# Patient Record
Sex: Male | Born: 1980 | Race: Asian | Hispanic: No | Marital: Married | Smoking: Never smoker
Health system: Southern US, Community
[De-identification: ages and names within clinical notes are randomized; demographics above are authoritative.]

## PROBLEM LIST (undated history)

## (undated) DIAGNOSIS — L80 Vitiligo: Secondary | ICD-10-CM

## (undated) DIAGNOSIS — E785 Hyperlipidemia, unspecified: Secondary | ICD-10-CM

## (undated) HISTORY — DX: Hyperlipidemia, unspecified: E78.5

## (undated) HISTORY — PX: NASAL SEPTUM SURGERY: SHX37

## (undated) HISTORY — DX: Vitiligo: L80

---

## 2014-03-20 ENCOUNTER — Ambulatory Visit (INDEPENDENT_AMBULATORY_CARE_PROVIDER_SITE_OTHER): Payer: Managed Care, Other (non HMO) | Admitting: Family Medicine

## 2014-03-20 ENCOUNTER — Encounter: Payer: Self-pay | Admitting: Family Medicine

## 2014-03-20 VITALS — BP 111/68 | HR 68 | Temp 98.2°F | Resp 20 | Ht 66.75 in | Wt 136.2 lb

## 2014-03-20 DIAGNOSIS — J029 Acute pharyngitis, unspecified: Secondary | ICD-10-CM

## 2014-03-20 DIAGNOSIS — R05 Cough: Secondary | ICD-10-CM

## 2014-03-20 DIAGNOSIS — J3489 Other specified disorders of nose and nasal sinuses: Secondary | ICD-10-CM

## 2014-03-20 DIAGNOSIS — R040 Epistaxis: Secondary | ICD-10-CM

## 2014-03-20 DIAGNOSIS — R0981 Nasal congestion: Secondary | ICD-10-CM

## 2014-03-20 DIAGNOSIS — R059 Cough, unspecified: Secondary | ICD-10-CM

## 2014-03-20 LAB — POCT RAPID STREP A (OFFICE): Rapid Strep A Screen: NEGATIVE

## 2014-03-20 MED ORDER — AMOXICILLIN 875 MG PO TABS
875.0000 mg | ORAL_TABLET | Freq: Two times a day (BID) | ORAL | Status: DC
Start: 2014-03-20 — End: 2014-05-14

## 2014-03-20 NOTE — Patient Instructions (Signed)
Drink plenty of fluids including water, juice, tea.  Take over-the-counter loratadine one daily. The pharmacist can help you to find this.  Take the amoxicillin one twice daily for infection  Take Tylenol (acetaminophen, paracetamol) 500 mg 2 tab 2 or 3 times daily as needed for pain in throat  Return if worse  Referral is being made to a specialist, and ear nose and throat (ENT) doctor. The referrals staff at this office will make that appointment and will contact you. If you do not hear from this office within the next 10 days, call and ask if the referral is being made.

## 2014-03-20 NOTE — Progress Notes (Signed)
Subjective:  33 year old patient from [place] who is here in the [place] studying English and he intends to study health administration. He is here with his wife and daughter. He has been ill for about 3 days with a sore throat and some cough. He does intermittently blows some blood from his nose, has a long history of that and what sounds like sinus problems. His doctor in [place] had plan to do some nasal surgery, but there was not sufficient time before he came to the [place]. He has not been having a fever, he does cough up some phlegm which is bloody at times.gets some blood from his nose.this is been bothering him for a long time. No records are available from his doctor in [place].  Objective: 33 year old man who speaks limited broken [place]. His wife speaks better [place] and helped to interpret. His TMs are normal. Nose appears normal. Throat clear without erythema. Strep screen was taken due to the pain. Neck supple without significant nodes.chest is clear to auscultation. Heart regular without murmurs.  Assessment: Pharyngitis Cough History of nasal/sinus problems  Plan: Strep screen is taken  Results for orders placed in visit on 03/20/14  POCT RAPID STREP A (OFFICE)      Result Value Ref Range   Rapid Strep A Screen Negative  Negative   In repeat discussing things it turns out he does not cough up blood, just gets the blood from his nose.he has no records from his doctor in [place]. He cannot get the name or the records.

## 2014-03-23 LAB — CULTURE, GROUP A STREP: Organism ID, Bacteria: NORMAL

## 2014-04-27 ENCOUNTER — Ambulatory Visit (HOSPITAL_COMMUNITY)
Admission: RE | Admit: 2014-04-27 | Discharge: 2014-04-27 | Disposition: A | Discharge status: Home / Self Care | Payer: Medicare HMO | Source: Ambulatory Visit | Attending: Otolaryngology | Admitting: Otolaryngology

## 2014-04-27 ENCOUNTER — Other Ambulatory Visit (HOSPITAL_COMMUNITY): Payer: Self-pay | Admitting: Otolaryngology

## 2014-04-27 DIAGNOSIS — J329 Chronic sinusitis, unspecified: Secondary | ICD-10-CM | POA: Insufficient documentation

## 2014-05-14 ENCOUNTER — Ambulatory Visit (INDEPENDENT_AMBULATORY_CARE_PROVIDER_SITE_OTHER): Payer: Managed Care, Other (non HMO) | Admitting: Physician Assistant

## 2014-05-14 VITALS — BP 120/70 | HR 100 | Temp 102.2°F | Resp 20 | Ht 66.0 in | Wt 135.2 lb

## 2014-05-14 DIAGNOSIS — J029 Acute pharyngitis, unspecified: Secondary | ICD-10-CM

## 2014-05-14 DIAGNOSIS — J039 Acute tonsillitis, unspecified: Secondary | ICD-10-CM

## 2014-05-14 LAB — POCT CBC
Granulocyte percent: 81.6 %G — AB (ref 37–80)
HCT, POC: 42.7 % — AB (ref 43.5–53.7)
Hemoglobin: 14.2 g/dL (ref 14.1–18.1)
Lymph, poc: 1.2 (ref 0.6–3.4)
MCH, POC: 30.2 pg (ref 27–31.2)
MCHC: 33.3 g/dL (ref 31.8–35.4)
MCV: 90.8 fL (ref 80–97)
MID (cbc): 0.5 (ref 0–0.9)
MPV: 6.6 fL (ref 0–99.8)
PLATELET COUNT, POC: 236 10*3/uL (ref 142–424)
POC Granulocyte: 7.2 — AB (ref 2–6.9)
POC LYMPH %: 13.1 % (ref 10–50)
POC MID %: 5.3 %M (ref 0–12)
RBC: 4.7 M/uL (ref 4.69–6.13)
RDW, POC: 13.7 %
WBC: 8.8 10*3/uL (ref 4.6–10.2)

## 2014-05-14 LAB — POCT RAPID STREP A (OFFICE): RAPID STREP A SCREEN: NEGATIVE

## 2014-05-14 MED ORDER — FIRST-MOUTHWASH BLM MT SUSP: OROMUCOSAL | Status: DC

## 2014-05-14 MED ORDER — CLINDAMYCIN HCL 300 MG PO CAPS: 300.0000 mg | ORAL_CAPSULE | Freq: Three times a day (TID) | ORAL | Status: DC

## 2014-05-14 NOTE — Progress Notes (Signed)
   Subjective:    Patient ID: Miguel Stewart, male    DOB: 02-Jan-1981, 33 y.o.   MRN: [id number]  HPI Pt presents to clinic with sore throat for the last 2 days that seems to be getting worse and congestion without rhinorrhea.  His daughter was sick but only with congestion she is not complaining of a sore throat.  He has had fevers and chills since the onset 3 days ago.  He had Augmentin that he had at home for 3 days but he is not feeling any better since starting that. He has myalgias and just does not feel good.  OTC meds - tylenol Sick contacts - family members are also sick  Review of Systems  Constitutional: Positive for chills. Negative for fever.  HENT: Positive for congestion, ear pain, sneezing and sore throat. Negative for rhinorrhea.   Respiratory: Negative for cough.   Gastrointestinal: Negative for nausea, vomiting and diarrhea.  Musculoskeletal: Negative for myalgias.  Neurological: Positive for headaches.       Objective:   Physical Exam  Vitals reviewed. Constitutional: He is oriented to person, place, and time. He appears well-developed and well-nourished.  HENT:  Head: Normocephalic and atraumatic.  Right Ear: Hearing, tympanic membrane, external ear and ear canal normal.  Left Ear: Hearing, tympanic membrane, external ear and ear canal normal.  Nose: Nose normal.  Mouth/Throat: Uvula is midline. Oropharyngeal exudate, posterior oropharyngeal edema (ulcerations with exudate on bilateral tonsils) and posterior oropharyngeal erythema present.  Cardiovascular: Normal rate, regular rhythm and normal heart sounds.   No murmur heard. Pulmonary/Chest: Effort normal and breath sounds normal. He has no wheezes.  Lymphadenopathy:       Head (right side): No tonsillar and no occipital adenopathy present.       Head (left side): No tonsillar and no occipital adenopathy present.    He has cervical adenopathy.       Right cervical: Superficial cervical adenopathy present.         Left cervical: Superficial cervical adenopathy present.       Right: No supraclavicular adenopathy present.       Left: No supraclavicular adenopathy present.  Neurological: He is alert and oriented to person, place, and time.  Skin: Skin is warm and dry.  Psychiatric: He has a normal mood and affect. His behavior is normal. Judgment and thought content normal.      Assessment & Plan:  Sore throat - Plan: POCT rapid strep A, POCT CBC, DPH-Lido-AlHydr-MgHydr-Simeth (FIRST-MOUTHWASH BLM) SUSP  Acute tonsillitis - Plan: clindamycin (CLEOCIN) 300 MG capsule  Due to his past 3 days of abx use and granulocyte elevation and tonsillar ulceration and fever today we are going to cover with clindamycin because after 3 days of Augmentin he is not feeling better.  He will add regular dosing of Tylenol and motrin to help control his fever which will make him feel better.  Other symptomatic care d/w pt.  Benny Lennert PA-C  [facility] 05/14/2014 3:34 PM

## 2014-06-15 ENCOUNTER — Ambulatory Visit (INDEPENDENT_AMBULATORY_CARE_PROVIDER_SITE_OTHER): Payer: Managed Care, Other (non HMO) | Admitting: Emergency Medicine

## 2014-06-15 VITALS — BP 116/70 | HR 64 | Temp 97.8°F | Resp 18 | Ht 67.0 in | Wt 138.0 lb

## 2014-06-15 DIAGNOSIS — L80 Vitiligo: Secondary | ICD-10-CM

## 2014-06-15 DIAGNOSIS — Z13220 Encounter for screening for lipoid disorders: Secondary | ICD-10-CM

## 2014-06-15 LAB — COMPREHENSIVE METABOLIC PANEL
ALBUMIN: 4.5 g/dL (ref 3.5–5.2)
ALT: 18 U/L (ref 0–53)
AST: 18 U/L (ref 0–37)
Alkaline Phosphatase: 30 U/L — ABNORMAL LOW (ref 39–117)
BUN: 10 mg/dL (ref 6–23)
CHLORIDE: 101 meq/L (ref 96–112)
CO2: 28 meq/L (ref 19–32)
CREATININE: 0.81 mg/dL (ref 0.50–1.35)
Calcium: 9.6 mg/dL (ref 8.4–10.5)
Glucose, Bld: 92 mg/dL (ref 70–99)
POTASSIUM: 4.3 meq/L (ref 3.5–5.3)
Sodium: 138 mEq/L (ref 135–145)
Total Bilirubin: 0.5 mg/dL (ref 0.2–1.2)
Total Protein: 7 g/dL (ref 6.0–8.3)

## 2014-06-15 LAB — POCT CBC
Granulocyte percent: 46.8 %G (ref 37–80)
HEMATOCRIT: 44.2 % (ref 43.5–53.7)
HEMOGLOBIN: 14.3 g/dL (ref 14.1–18.1)
Lymph, poc: 2.3 (ref 0.6–3.4)
MCH: 29.4 pg (ref 27–31.2)
MCHC: 32.3 g/dL (ref 31.8–35.4)
MCV: 91.1 fL (ref 80–97)
MID (cbc): 0.3 (ref 0–0.9)
MPV: 6.9 fL (ref 0–99.8)
POC Granulocyte: 2.2 (ref 2–6.9)
POC LYMPH PERCENT: 47 %L (ref 10–50)
POC MID %: 6.2 %M (ref 0–12)
Platelet Count, POC: 193 10*3/uL (ref 142–424)
RBC: 4.85 M/uL (ref 4.69–6.13)
RDW, POC: 13 %
WBC: 4.8 10*3/uL (ref 4.6–10.2)

## 2014-06-15 LAB — LIPID PANEL
CHOL/HDL RATIO: 1.9 ratio
CHOLESTEROL: 276 mg/dL — AB (ref 0–200)
HDL: 145 mg/dL (ref >39)
LDL Cholesterol: 103 mg/dL — ABNORMAL HIGH (ref 0–99)
Triglycerides: 141 mg/dL (ref <150)
VLDL: 28 mg/dL (ref 0–40)

## 2014-06-15 MED ORDER — BETAMETHASONE VALERATE 0.1 % EX OINT: 1.0000 "application " | TOPICAL_OINTMENT | Freq: Two times a day (BID) | CUTANEOUS | Status: DC

## 2014-06-15 NOTE — Progress Notes (Signed)
[facility] [address], [place] [address] [phone]  Date:  06/15/2014   Name:  Miguel Stewart   DOB:  07/20/1981   MRN:  [id number]  PCP:  No PCP Per Patient    Chief Complaint: rx refills and Hyperlipidemia   History of Present Illness:  Miguel Stewart is a 33 y.o. very pleasant male patient who presents with the following:  Requesting a refill on medication for his steroid cream.  Was prescribed in [place]. Skin lesion has improved but is not resolved Requests labs to check for HLD No history and not on meds. No improvement with over the counter medications or other home remedies.  Denies other complaint or health concern today.   There are no active problems to display for this patient.   Past Medical History  Diagnosis Date  . Hyperlipidemia     History reviewed. No pertinent past surgical history.  History  Substance Use Topics  . Smoking status: Never Smoker   . Smokeless tobacco: Not on file  . Alcohol Use: No    History reviewed. No pertinent family history.  No Known Allergies  Medication list has been reviewed and updated.  Current Outpatient Prescriptions on File Prior to Visit  Medication Sig Dispense Refill  . clindamycin (CLEOCIN) 300 MG capsule Take 1 capsule (300 mg total) by mouth 3 (three) times daily.  30 capsule  0  . DPH-Lido-AlHydr-MgHydr-Simeth (FIRST-MOUTHWASH BLM) SUSP 5 ml every 1-2h prn sore throat - swish gargle spit.  120 mL  0   No current facility-administered medications on file prior to visit.    Review of Systems:  As per HPI, otherwise negative.    Physical Examination: Filed Vitals:   06/15/14 1525  BP: 116/70  Pulse: 64  Temp: 97.8 F (36.6 C)  Resp: 18   Filed Vitals:   06/15/14 1525  Height: 5\' 7"  (1.702 m)  Weight: 138 lb (62.596 kg)   Body mass index is 21.61 kg/(m^2). Ideal Body Weight: Weight in (lb) to have BMI = 25: 159.3  GEN: WDWN, NAD, Non-toxic, A & O  x 3 HEENT: Atraumatic, Normocephalic. Neck supple. No masses, No LAD. Ears and Nose: No external deformity. CV: RRR, No M/G/R. No JVD. No thrill. No extra heart sounds. PULM: CTA B, no wheezes, crackles, rhonchi. No retractions. No resp. distress. No accessory muscle use. ABD: S, NT, ND, +BS. No rebound. No HSM. EXTR: No c/c/e NEURO Normal gait.  PSYCH: Normally interactive. Conversant. Not depressed or anxious appearing.  Calm demeanor.  Skin: vitiligo   Assessment and Plan: Vitiligo Refill meds Labs for HLD  Signed,  Phillips OdorJeffery Azka Steger, MD

## 2014-06-16 ENCOUNTER — Other Ambulatory Visit: Payer: Self-pay | Admitting: Emergency Medicine

## 2014-06-16 MED ORDER — ATORVASTATIN CALCIUM 20 MG PO TABS: 20.0000 mg | ORAL_TABLET | Freq: Every day | ORAL | Status: DC

## 2014-06-20 ENCOUNTER — Encounter: Payer: Self-pay | Admitting: Radiology

## 2014-07-13 ENCOUNTER — Ambulatory Visit (INDEPENDENT_AMBULATORY_CARE_PROVIDER_SITE_OTHER): Payer: Managed Care, Other (non HMO) | Admitting: Family Medicine

## 2014-07-13 VITALS — BP 110/60 | HR 77 | Temp 98.0°F | Resp 16 | Ht 67.0 in | Wt 136.8 lb

## 2014-07-13 DIAGNOSIS — J208 Acute bronchitis due to other specified organisms: Secondary | ICD-10-CM

## 2014-07-13 DIAGNOSIS — R05 Cough: Secondary | ICD-10-CM

## 2014-07-13 DIAGNOSIS — L80 Vitiligo: Secondary | ICD-10-CM

## 2014-07-13 DIAGNOSIS — R059 Cough, unspecified: Secondary | ICD-10-CM

## 2014-07-13 MED ORDER — BENZONATATE 100 MG PO CAPS: 100.0000 mg | ORAL_CAPSULE | Freq: Three times a day (TID) | ORAL | Status: DC | PRN

## 2014-07-13 MED ORDER — AZITHROMYCIN 250 MG PO TABS: ORAL_TABLET | ORAL | Status: DC

## 2014-07-13 MED ORDER — TACROLIMUS 0.1 % EX OINT: TOPICAL_OINTMENT | Freq: Two times a day (BID) | CUTANEOUS | Status: DC

## 2014-07-13 NOTE — Progress Notes (Signed)
[facility] [address], [place] [address] [phone]  Date:  07/13/2014   Name:  Miguel Stewart   DOB:  08/29/1981   MRN:  [id number]  PCP:  No PCP Per Patient    Chief Complaint: Cough   History of Present Illness:  Miguel Stewart is a 33 y.o. very pleasant male patient who presents with the following:  Here today with cough for one week. He has bene coughing up some phlegm.  His wife and daugther are ill with similar.  He is feeling tired, but no fever or aches.   No GI symptoms He is generally in good health.  He has tried some OTC cough syrup but it did not seem to help much.   NKDA.   There are no active problems to display for this patient.   Past Medical History  Diagnosis Date  . Hyperlipidemia     History reviewed. No pertinent past surgical history.  History  Substance Use Topics  . Smoking status: Never Smoker   . Smokeless tobacco: Not on file  . Alcohol Use: No    History reviewed. No pertinent family history.  No Known Allergies  Medication list has been reviewed and updated.  Current Outpatient Prescriptions on File Prior to Visit  Medication Sig Dispense Refill  . atorvastatin (LIPITOR) 20 MG tablet Take 1 tablet (20 mg total) by mouth daily. 90 tablet 3  . betamethasone valerate ointment (VALISONE) 0.1 % Apply 1 application topically 2 (two) times daily. 30 g 1   No current facility-administered medications on file prior to visit.    Review of Systems:  As per HPI- otherwise negative.   Physical Examination: Filed Vitals:   07/13/14 1639  BP: 110/60  Pulse: 77  Temp: 98 F (36.7 C)  Resp: 16   Filed Vitals:   07/13/14 1639  Height: 5\' 7"  (1.702 m)  Weight: 136 lb 12.8 oz (62.052 kg)   Body mass index is 21.42 kg/(m^2). Ideal Body Weight: Weight in (lb) to have BMI = 25: 159.3  GEN: WDWN, NAD, Non-toxic, A & O x 3, looks well, coughing  HEENT: Atraumatic, Normocephalic. Neck supple. No masses,  No LAD.  Bilateral TM wnl, oropharynx normal.  PEERL,EOMI.   Ears and Nose: No external deformity. CV: RRR, No M/G/R. No JVD. No thrill. No extra heart sounds. PULM: CTA B, no wheezes, crackles, rhonchi. No retractions. No resp. distress. No accessory muscle use. ABD: S, NT, ND, +BS. No rebound. No HSM. EXTR: No c/c/e NEURO Normal gait.  PSYCH: Normally interactive. Conversant. Not depressed or anxious appearing.  Calm demeanor.    Assessment and Plan: Acute bronchitis due to other specified organisms - Plan: azithromycin (ZITHROMAX) 250 MG tablet  Cough - Plan: benzonatate (TESSALON) 100 MG capsule  Vitiligo - Plan: tacrolimus (PROTOPIC) 0.1 % ointment  Treat for bronchitis with axithromycin and tessalon.  He has a little vitiligo on his face- he has been using protopic for this and thinks it is helping.  Refilled this for him See patient instructions for more details.     Signed Abbe AmsterdamJessica Copland, MD

## 2014-07-13 NOTE — Patient Instructions (Signed)
Use the azithromycin as directed for bronchitis, and the tessalon as needed for cough You might try some mucinex as well for chest congestion Let me know if you do not feel better soon-Sooner if worse.

## 2014-08-04 ENCOUNTER — Ambulatory Visit (INDEPENDENT_AMBULATORY_CARE_PROVIDER_SITE_OTHER): Payer: Managed Care, Other (non HMO) | Admitting: Internal Medicine

## 2014-08-04 VITALS — BP 106/62 | HR 64 | Temp 98.5°F | Resp 16 | Ht 66.5 in | Wt 135.0 lb

## 2014-08-04 DIAGNOSIS — L80 Vitiligo: Secondary | ICD-10-CM

## 2014-08-04 DIAGNOSIS — Z1321 Encounter for screening for nutritional disorder: Secondary | ICD-10-CM

## 2014-08-04 MED ORDER — KETOCONAZOLE 2 % EX CREA: 1.0000 "application " | TOPICAL_CREAM | Freq: Every day | CUTANEOUS | Status: DC

## 2014-08-04 MED ORDER — HYDROCORTISONE 2.5 % EX CREA: TOPICAL_CREAM | Freq: Two times a day (BID) | CUTANEOUS | Status: DC

## 2014-08-04 MED ORDER — TACROLIMUS 0.1 % EX OINT: TOPICAL_OINTMENT | Freq: Two times a day (BID) | CUTANEOUS | Status: DC

## 2014-08-04 NOTE — Progress Notes (Signed)
Subjective:    Patient ID: Miguel Stewart, male    DOB: 12/05/1980, 33 y.o.   MRN: [id number]  This chart was scribed for Ellamae Siaobert Wendelin Reader, MD by Ronney LionSuzanne Le, ED Scribe. This patient was seen in room 4 and the patient's care was started at 4:30 PM.   HPI  HPI Comments: Miguel Stewart is a 33 y.o. male who [facility] medication refill on Lipitor as well as on a topical cream for vitiligo. Patient's father has a history of hyperlipidemia. He says that the vitiligo medication on his right cheek and left arm and other spots, which have been present for one year, helps. He denies any itching on his skin. He denies a family history of heart problems.  Also questions re eyes-has white rings on cornea like mom--no vis probs  Wants vit d level  See last ov where lipitor started after labs   Active Ambulatory Problems    Diagnosis Date Noted  . No Active Ambulatory Problems   Resolved Ambulatory Problems    Diagnosis Date Noted  . No Resolved Ambulatory Problems   Past Medical History  Diagnosis Date  . Hyperlipidemia     Prior to Admission medications   Medication Sig Start Date End Date Taking? Authorizing Provider  atorvastatin (LIPITOR) 20 MG tablet Take 1 tablet (20 mg total) by mouth daily. 06/16/14  Yes Carmelina DaneJeffery S Anderson, MD  betamethasone valerate ointment (VALISONE) 0.1 % Apply 1 application topically 2 (two) times daily. 06/15/14  Yes Carmelina DaneJeffery S Anderson, MD  tacrolimus (PROTOPIC) 0.1 % ointment Apply topically 2 (two) times daily. 07/13/14  Yes Pearline CablesJessica C Copland, MD   has No Known Allergies.   Review of Systems  Non-contributory.     Objective:   Physical Exam  Constitutional: He is oriented to person, place, and time. He appears well-developed and well-nourished. No distress.  HENT:  Head: Normocephalic and atraumatic.  Eyes: Conjunctivae and EOM are normal. Pupils are equal, round, and reactive to light.  Both irises have  white circle at margin deeper than surface  Neck: Neck supple.  Cardiovascular: Normal rate.   Pulmonary/Chest: Effort normal. No respiratory distress.  Musculoskeletal: Normal range of motion.  Neurological: He is alert and oriented to person, place, and time.  Skin: Skin is warm and dry.  Has various skin changes R max area w/ white beard hairs and 2 circ lesion sl irritated/sl hypopig like tinea treated with steroids Several white circ macules scattered without surface features like vitiligo Lips with various hues light to dark Corners like ang cheilosis  Psychiatric: He has a normal mood and affect. His behavior is normal.  Nursing note and vitals reviewed.     Assessment & Plan:   I have completed the patient encounter in its entirety as documented by the scribe, with editing by me where necessary. Caron Ode P. Merla Richesoolittle, M.D. Vitiligo - Plan: tacrolimus (PROTOPIC) 0.1 % ointment  Encounter for vitamin deficiency screening - Plan: Vit D  25 hydroxy---not sure why he's concerned about this  Hyperlipidemia---looking at labs he has normal LDL and very high HDL making tot choles appear too high   He does not need statins!!!!! 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

## 2014-08-04 NOTE — Patient Instructions (Signed)
Lab Results  Component Value Date   CHOL 276* 06/15/2014   HDL 145 06/15/2014   LDLCALC 103* 06/15/2014   TRIG 141 06/15/2014   CHOLHDL 1.9 06/15/2014   Cholesterol Cholesterol is a white, waxy, fat-like substance needed by your body in small amounts. The liver makes all the cholesterol you need. Cholesterol is carried from the liver by the blood through the blood vessels. Deposits of cholesterol (plaque) may build up on blood vessel walls. These make the arteries narrower and stiffer. Cholesterol plaques increase the risk for heart attack and stroke.  You cannot feel your cholesterol level even if it is very high. The only way to know it is high is with a blood test. Once you know your cholesterol levels, you should keep a record of the test results. Work with your health care provider to keep your levels in the desired range.  WHAT DO THE RESULTS MEAN?  Total cholesterol is a rough measure of all the cholesterol in your blood.   LDL is the so-called bad cholesterol. This is the type that deposits cholesterol in the walls of the arteries. You want this level to be low.   HDL is the good cholesterol because it cleans the arteries and carries the LDL away. You want this level to be high.  Triglycerides are fat that the body can either burn for energy or store. High levels are closely linked to heart disease.  WHAT ARE THE DESIRED LEVELS OF CHOLESTEROL?  Total cholesterol below 200.   LDL below 100 for people at risk, below 70 for those at very high risk.   HDL above 50 is good, above 60 is best.   Triglycerides below 150.  HOW CAN I LOWER MY CHOLESTEROL?  Diet. Follow your diet programs as directed by your health care provider.   Choose fish or white meat chicken and [place]key, roasted or baked. Limit fatty cuts of red meat, fried foods, and processed meats, such as sausage and lunch meats.   Eat lots of fresh fruits and vegetables.  Choose whole grains, beans, pasta,  potatoes, and cereals.   Use only small amounts of olive, corn, or canola oils.   Avoid butter, mayonnaise, shortening, or palm kernel oils.  Avoid foods with trans fats.   Drink skim or nonfat milk and eat low-fat or nonfat yogurt and cheeses. Avoid whole milk, cream, ice cream, egg yolks, and full-fat cheeses.   Healthy desserts include angel food cake, ginger snaps, animal crackers, hard candy, popsicles, and low-fat or nonfat frozen yogurt. Avoid pastries, cakes, pies, and cookies.   Exercise. Follow your exercise programs as directed by your health care provider.   A regular program helps decrease LDL and raise HDL.   A regular program helps with weight control.   Do things that increase your activity level like gardening, walking, or taking the stairs. Ask your health care provider about how you can be more active in your daily life.   Medicine. Take medicine only as directed by your health care provider.   Medicine may be prescribed by your health care provider to help lower cholesterol and decrease the risk for heart disease.   If you have several risk factors, you may need medicine even if your levels are normal. Document Released: 05/13/2001 Document Revised: 01/02/2014 Document Reviewed: 06/01/2013 [facility] Patient Information 2015 [employer]. This information is not intended to replace advice given to you by your health care provider. Make sure you discuss any questions you have  with your health care provider.  

## 2014-08-07 LAB — VITAMIN D 25 HYDROXY (VIT D DEFICIENCY, FRACTURES): Vit D, 25-Hydroxy: 13 ng/mL — ABNORMAL LOW (ref 30–100)

## 2014-08-08 ENCOUNTER — Encounter: Payer: Self-pay | Admitting: Internal Medicine

## 2014-08-08 DIAGNOSIS — Z23 Encounter for immunization: Secondary | ICD-10-CM

## 2015-02-23 IMAGING — CT CT PARANASAL SINUSES LIMITED
1 series · 9 of 11 positions shown, 12 images · non-contrast
Comparison: None.

CLINICAL DATA: 32-year-old male with sinusitis. Maxillary pain. No
improvement with medical therapy. Initial encounter.

EXAM:
LIMITED CT PARANASAL SINUS WITHOUT CONTRAST
TECHNIQUE: Multidetector CT images of the paranasal sinuses were obtained using
the standard protocol without intravenous contrast.

[Series 4: limited sinus st · axial · 0.34mm/px · z∈[-223,-143]mm · 9 of 11 slices shown, 12 images]
[im 2/11  brain]
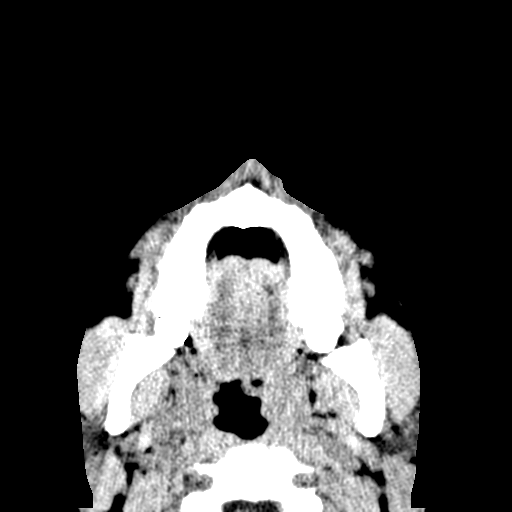
[im 2/11  bone]
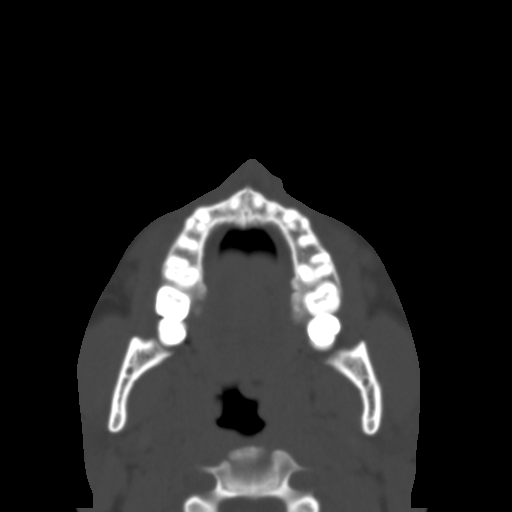
[im 3/11  bone]
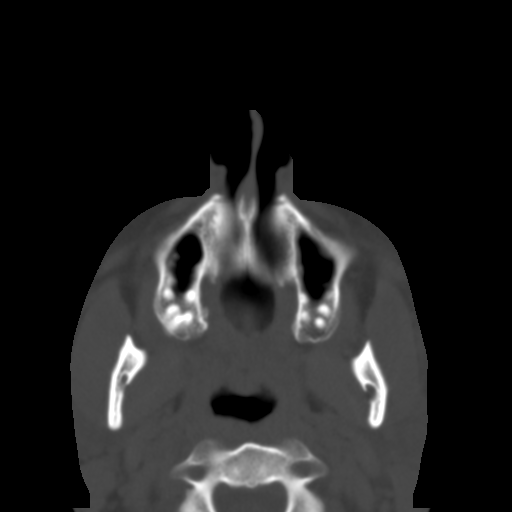
[im 4/11  bone]
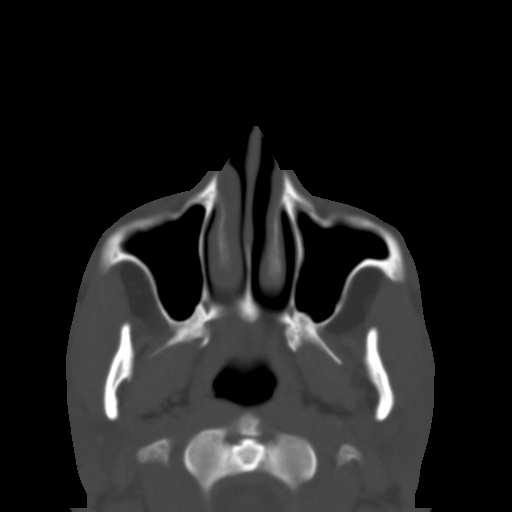
[im 5/11  bone]
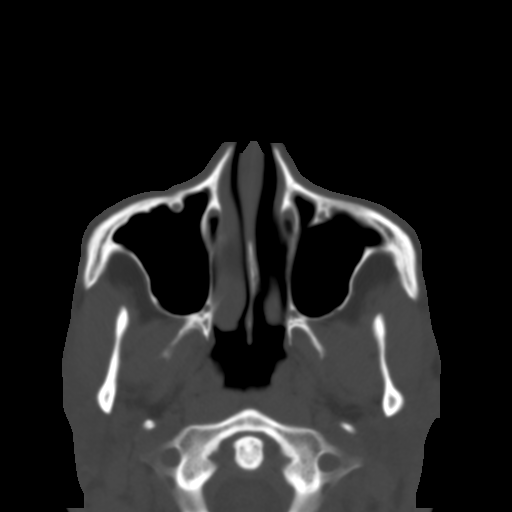
[im 6/11  brain]
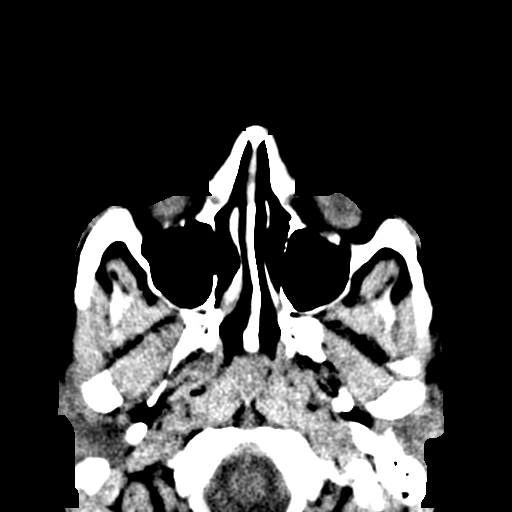
[im 6/11  bone]
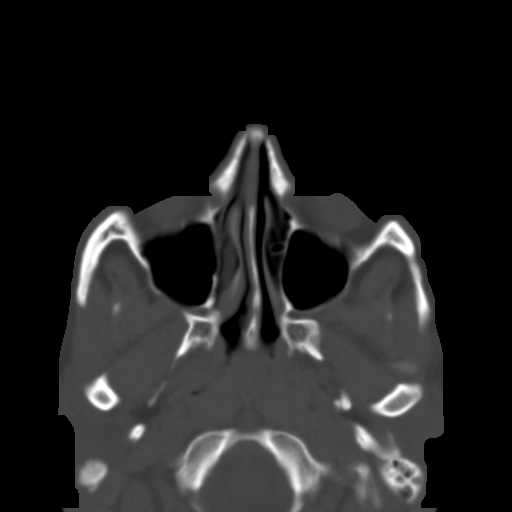
[im 7/11  bone]
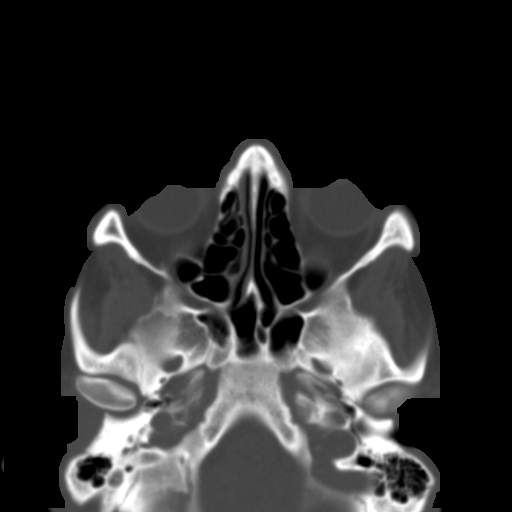
[im 8/11  bone]
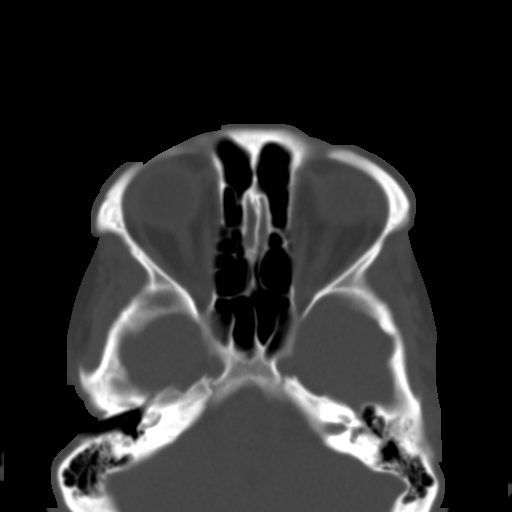
[im 9/11  bone]
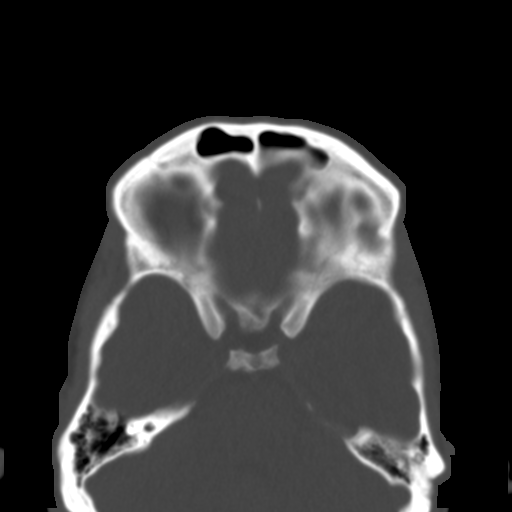
[im 10/11  brain]
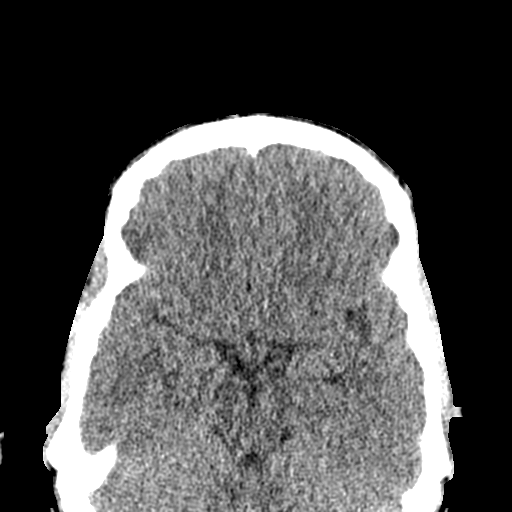
[im 10/11  bone]
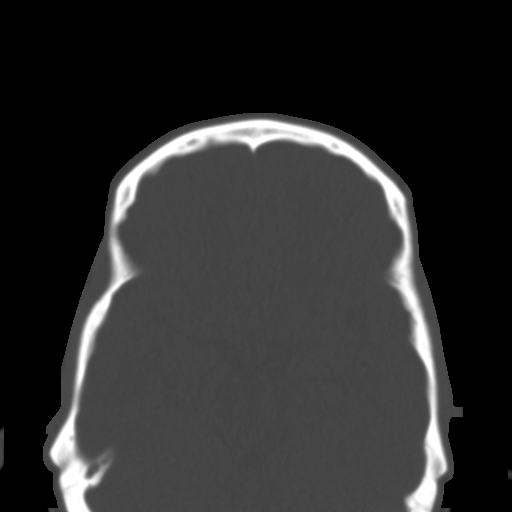

[9 of 11 positions shown; findings below may reference images not displayed]

FINDINGS: Grossly negative visualized non contrast brain parenchyma.
Visualized orbit soft tissues are within normal limits. Grossly
negative visible non contrast deep soft tissue spaces of the face.

Visualized mastoids and tympanic cavities are clear.

Visualized sphenoid sinuses, ethmoid air cells, frontal sinuses, and
maxillary sinuses are clear.

Mild leftward nasal deviation posteriorly.
IMPRESSION: Normal limited CT of the paranasal sinuses.

## 2015-06-13 ENCOUNTER — Ambulatory Visit (INDEPENDENT_AMBULATORY_CARE_PROVIDER_SITE_OTHER): Payer: PPO | Admitting: Physician Assistant

## 2015-06-13 VITALS — BP 116/76 | HR 70 | Temp 98.5°F | Resp 16 | Ht 67.0 in | Wt 137.0 lb

## 2015-06-13 DIAGNOSIS — J3489 Other specified disorders of nose and nasal sinuses: Secondary | ICD-10-CM

## 2015-06-13 DIAGNOSIS — L80 Vitiligo: Secondary | ICD-10-CM | POA: Diagnosis not present

## 2015-06-13 DIAGNOSIS — E559 Vitamin D deficiency, unspecified: Secondary | ICD-10-CM | POA: Diagnosis not present

## 2015-06-13 DIAGNOSIS — E785 Hyperlipidemia, unspecified: Secondary | ICD-10-CM | POA: Diagnosis not present

## 2015-06-13 LAB — CBC
HEMATOCRIT: 42.2 % (ref 39.0–52.0)
Hemoglobin: 14.6 g/dL (ref 13.0–17.0)
MCH: 30 pg (ref 26.0–34.0)
MCHC: 34.6 g/dL (ref 30.0–36.0)
MCV: 86.7 fL (ref 78.0–100.0)
MPV: 8.9 fL (ref 8.6–12.4)
PLATELETS: 231 10*3/uL (ref 150–400)
RBC: 4.87 MIL/uL (ref 4.22–5.81)
RDW: 12.7 % (ref 11.5–15.5)
WBC: 4.5 10*3/uL (ref 4.0–10.5)

## 2015-06-13 LAB — LIPID PANEL
CHOL/HDL RATIO: 1.6 ratio (ref <=5.0)
CHOLESTEROL: 258 mg/dL — AB (ref 125–200)
HDL: 162 mg/dL (ref >=40)
LDL Cholesterol: 80 mg/dL (ref <130)
TRIGLYCERIDES: 80 mg/dL (ref <150)
VLDL: 16 mg/dL (ref <30)

## 2015-06-13 LAB — COMPLETE METABOLIC PANEL WITH GFR
ALT: 13 U/L (ref 9–46)
AST: 17 U/L (ref 10–40)
Albumin: 4.6 g/dL (ref 3.6–5.1)
Alkaline Phosphatase: 27 U/L — ABNORMAL LOW (ref 40–115)
BUN: 12 mg/dL (ref 7–25)
CALCIUM: 9.7 mg/dL (ref 8.6–10.3)
CO2: 30 mmol/L (ref 20–31)
CREATININE: 0.91 mg/dL (ref 0.60–1.35)
Chloride: 101 mmol/L (ref 98–110)
Glucose, Bld: 88 mg/dL (ref 65–99)
Potassium: 4.5 mmol/L (ref 3.5–5.3)
Sodium: 138 mmol/L (ref 135–146)
Total Bilirubin: 0.6 mg/dL (ref 0.2–1.2)
Total Protein: 6.7 g/dL (ref 6.1–8.1)

## 2015-06-13 MED ORDER — IPRATROPIUM BROMIDE 0.03 % NA SOLN: 2.0000 | Freq: Two times a day (BID) | NASAL | Status: DC

## 2015-06-13 MED ORDER — MUPIROCIN 2 % EX OINT: 1.0000 "application " | TOPICAL_OINTMENT | Freq: Two times a day (BID) | CUTANEOUS | Status: DC

## 2015-06-13 MED ORDER — TACROLIMUS 0.1 % EX OINT: TOPICAL_OINTMENT | Freq: Two times a day (BID) | CUTANEOUS | Status: DC

## 2015-06-13 NOTE — Progress Notes (Signed)
[facility] [address], [place] [address] [phone]  Date:  06/13/2015   Name:  Miguel Stewart   DOB:  03/23/1981   MRN:  [id number]  PCP:  No PCP Per Patient    History of Present Illness: Moderate language barrier.    Miguel Stewart is a 34 y.o. male patient who [facility] for chief complaint of right nostril pain, medication refill, and cholesterol recheck.  Patient states that he has had this nostril pain for several days.  There is no fever, or noticeable swelling.  He does have some nasal congestion, but no cough, ear discomfort, or sore throat.    He would also like refill of the tacrolimus for his vitiligo.  He states that the medication does help stop the vitligo.    He is also requesting cholesterol recheck.  He was placed on atorvastatin, but stopped when a provider had suggested that he no longer needed it, as his HDL was elevated 145.  He has had no cardiovascular concerns.  Mother and father with possible hyperlipidemia.   He would also like his vitamin D rechecked.  He is taking daily vitamin D medication, though can not confirm dosage at this time.     There are no active problems to display for this patient.   Past Medical History  Diagnosis Date  . Hyperlipidemia     History reviewed. No pertinent past surgical history.  Social History  Substance Use Topics  . Smoking status: Never Smoker   . Smokeless tobacco: Never Used  . Alcohol Use: No    History reviewed. No pertinent family history.  No Known Allergies  Medication list has been reviewed and updated.  Current Outpatient Prescriptions on File Prior to Visit  Medication Sig Dispense Refill  . tacrolimus (PROTOPIC) 0.1 % ointment Apply topically 2 (two) times daily. 100 g 2  . atorvastatin (LIPITOR) 20 MG tablet Take 1 tablet (20 mg total) by mouth daily. (Patient not taking: Reported on 06/13/2015) 90 tablet 3   No current facility-administered  medications on file prior to visit.    ROS ROS otherwise unremarkable unless listed above.   Physical Examination: BP 116/76 mmHg  Pulse 70  Temp(Src) 98.5 F (36.9 C) (Oral)  Resp 16  Ht 5\' 7"  (1.702 m)  Wt 137 lb (62.143 kg)  BMI 21.45 kg/m2  SpO2 98% Ideal Body Weight: Weight in (lb) to have BMI = 25: 159.3  Physical Exam  Constitutional: He is oriented to person, place, and time. He appears well-developed and well-nourished. No distress.  HENT:  Head: Normocephalic and atraumatic.  Tenderness at the distal right nostril.  There is mild erythema and increased dried exudate than mucus.    Eyes: Conjunctivae and EOM are normal. Pupils are equal, round, and reactive to light.  Cardiovascular: Normal rate.  Exam reveals no gallop and no friction rub.   No murmur heard. Pulmonary/Chest: Effort normal. No respiratory distress.  Neurological: He is alert and oriented to person, place, and time.  Skin: Skin is warm and dry. He is not diaphoretic.  Hypopigmented patches along the face, and arms.  Hypopigmented patches also have white hair from follicles.  Trunk is spared.   Psychiatric: He has a normal mood and affect. His behavior is normal.     Assessment and Plan: Miguel Stewart is a 34 y.o. male who is here today for cholesterol recheck, recheck of vitamin D, medication refill, and nasal pain. -Will treat nostril  with mupirocin ointment.  Also given nasal spray to open nasal passages.  Will decide on a statin therapy pending lab results.   Nasal pain - Plan: mupirocin ointment (BACTROBAN) 2 %, ipratropium (ATROVENT) 0.03 % nasal spray  Hyperlipidemia - Plan: Lipid panel, CBC, COMPLETE METABOLIC PANEL WITH GFR  Vitiligo - Plan: CBC, DISCONTINUED: tacrolimus (PROTOPIC) 0.1 % ointment  Vitamin D deficiency - Plan: CBC, Vit D  25 hydroxy (rtn osteoporosis monitoring)     Trena Platt, PA-C [facility] 06/13/2015 3:51  PM

## 2015-06-13 NOTE — Patient Instructions (Addendum)
Please use the ointment as prescribed, for 10 days.  If this does not feel better, you need to return. Also use the nasal spray to help with the congestion.   I will contact you within a week of your lab results and to decide if you need to be placed back on the Lipitor.

## 2015-06-14 ENCOUNTER — Other Ambulatory Visit: Payer: Self-pay

## 2015-06-14 DIAGNOSIS — L80 Vitiligo: Secondary | ICD-10-CM

## 2015-06-14 LAB — VITAMIN D 25 HYDROXY (VIT D DEFICIENCY, FRACTURES): VIT D 25 HYDROXY: 29 ng/mL — AB (ref 30–100)

## 2015-06-14 MED ORDER — TACROLIMUS 0.1 % EX OINT: TOPICAL_OINTMENT | Freq: Two times a day (BID) | CUTANEOUS | Status: DC

## 2015-12-18 ENCOUNTER — Ambulatory Visit (INDEPENDENT_AMBULATORY_CARE_PROVIDER_SITE_OTHER): Payer: PPO | Admitting: Family Medicine

## 2015-12-18 VITALS — BP 126/72 | HR 95 | Temp 99.0°F | Resp 18 | Ht 66.14 in | Wt 143.0 lb

## 2015-12-18 DIAGNOSIS — J069 Acute upper respiratory infection, unspecified: Secondary | ICD-10-CM | POA: Diagnosis not present

## 2015-12-18 MED ORDER — HYDROCODONE-HOMATROPINE 5-1.5 MG/5ML PO SYRP
5.0000 mL | ORAL_SOLUTION | Freq: Three times a day (TID) | ORAL | Status: DC | PRN
Start: 2015-12-18 — End: 2016-04-19

## 2015-12-18 MED ORDER — IBUPROFEN 600 MG PO TABS: 600.0000 mg | ORAL_TABLET | Freq: Three times a day (TID) | ORAL | Status: DC | PRN

## 2015-12-18 MED ORDER — AZITHROMYCIN 250 MG PO TABS: ORAL_TABLET | ORAL | Status: DC

## 2015-12-18 NOTE — Progress Notes (Signed)
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 He comes in with a 10 day history of progressive sore throat, sneezing, chest congestion, malaise, myalgia, and low-grade fever. He's tried some over-the-counter acetaminophen as well as having gone to another clinic where he got some nasal sprays.  Objective:BP 126/72 mmHg  Pulse 95  Temp(Src) 99 F (37.2 C)  Resp 18  Ht 5' 6.14" (1.68 m)  Wt 143 lb (64.864 kg)  BMI 22.98 kg/m2  SpO2 97% Patient appears to be in no acute distress HEENT: Slight erythema in the throat otherwise unremarkable Neck: Supple: Tender submandibular node on each side Chest: Few faint extra wheezes Heart: Regular, no murmur or gallop Skin: No rash  Assessment: Upper respiratory infection which is worsening her graft plan. Acute upper respiratory infection - Plan: HYDROcodone-homatropine (HYCODAN) 5-1.5 MG/5ML syrup, azithromycin (ZITHROMAX) 250 MG tablet, ibuprofen (ADVIL,MOTRIN) 600 MG tablet Miguel SidleKurt Melvinia Ashby, MD

## 2016-04-19 ENCOUNTER — Ambulatory Visit (INDEPENDENT_AMBULATORY_CARE_PROVIDER_SITE_OTHER): Payer: PPO | Admitting: Physician Assistant

## 2016-04-19 VITALS — BP 120/66 | HR 86 | Temp 98.3°F | Resp 16 | Ht 66.0 in | Wt 146.0 lb

## 2016-04-19 DIAGNOSIS — E785 Hyperlipidemia, unspecified: Secondary | ICD-10-CM | POA: Diagnosis not present

## 2016-04-19 DIAGNOSIS — L80 Vitiligo: Secondary | ICD-10-CM | POA: Diagnosis not present

## 2016-04-19 DIAGNOSIS — J301 Allergic rhinitis due to pollen: Secondary | ICD-10-CM | POA: Diagnosis not present

## 2016-04-19 MED ORDER — IPRATROPIUM BROMIDE 0.03 % NA SOLN: 2.0000 | Freq: Two times a day (BID) | NASAL | 0 refills | Status: DC

## 2016-04-19 MED ORDER — TACROLIMUS 0.1 % EX OINT: TOPICAL_OINTMENT | Freq: Two times a day (BID) | CUTANEOUS | 2 refills | Status: DC

## 2016-04-19 MED ORDER — FLUTICASONE PROPIONATE 50 MCG/ACT NA SUSP: 2.0000 | Freq: Every day | NASAL | 12 refills | Status: DC

## 2016-04-19 MED ORDER — FLUTICASONE PROPIONATE 50 MCG/ACT NA SUSP: 2.0000 | Freq: Every day | NASAL | 12 refills | Status: AC

## 2016-04-19 MED ORDER — AZELASTINE HCL 0.05 % OP SOLN: 1.0000 [drp] | Freq: Two times a day (BID) | OPHTHALMIC | 0 refills | Status: DC

## 2016-04-19 NOTE — Patient Instructions (Signed)
Please return for fasting labs at your convenience.  If the medications do not help, please return.    IF you received an x-ray today, you will receive an invoice from [facility]. Please contact [facility] at [phone] with questions or concerns regarding your invoice.   IF you received labwork today, you will receive an invoice from [facility]. Please contact [facility] at [phone] with questions or concerns regarding your invoice.   Our billing staff will not be able to assist you with questions regarding bills from these companies.  You will be contacted with the lab results as soon as they are available. The fastest way to get your results is to activate your My Chart account. Instructions are located on the last page of this paperwork. If you have not heard from us regarding the results in 2 weeks, please contact this office.

## 2016-04-19 NOTE — Progress Notes (Signed)
Patient ID: Miguel Stewart, male    DOB: 05/27/1981, 35 y.o.   MRN: [id number]  PCP: No PCP Per Patient  Subjective:   Chief Complaint  Patient presents with  . Allergies    Itchy eyes, nasal congestion    HPI Presents for evaluation of itchy eyes intermittently x 10 days and nasal congestion/sneezing x 3 days.  Has not taken anything to alleviate these symptoms.  In addition, he needs a refill of tacrolimus for vitiligo. He no longer takes atorvastatin, choosing to make healthy lifestyle changes. He is NOT fasting today.  Sore throat. No coughing. No fever/chills.  History of nasal turbinate surgery, but it was not helpful. Previously used Flonase and cetirizine, but doesn't recall whether they helped or not.  Review of Systems  Constitutional: Negative for chills and fever.  HENT: Positive for congestion, rhinorrhea, sneezing and sore throat. Negative for ear discharge, ear pain, facial swelling, sinus pressure, trouble swallowing and voice change.   Eyes: Positive for itching. Negative for photophobia, pain, discharge, redness and visual disturbance.  Respiratory: Negative for cough, shortness of breath and wheezing.   Cardiovascular: Negative for chest pain and palpitations.  Skin:       vitiligo       Patient Active Problem List   Diagnosis Date Noted  . Hyperlipidemia 04/19/2016  . Vitiligo 04/19/2016     Prior to Admission medications   Medication Sig Start Date End Date Taking? Authorizing Provider  tacrolimus (PROTOPIC) 0.1 % ointment Apply topically 2 (two) times daily. 06/14/15  Yes Stephanie D English, PA  atorvastatin (LIPITOR) 20 MG tablet Take 1 tablet (20 mg total) by mouth daily. Patient not taking: Reported on 06/13/2015 06/16/14   Carmelina DaneJeffery S Anderson, MD     No Known Allergies     Objective:  Physical Exam  Constitutional: He is oriented to person, place, and time. He appears well-developed and well-nourished. No distress.  BP 120/66    Pulse 86   Temp 98.3 F (36.8 C) (Oral)   Resp 16   Ht 5\' 6"  (1.676 m)   Wt 146 lb (66.2 kg)   SpO2 97%   BMI 23.57 kg/m    HENT:  Head: Normocephalic and atraumatic.  Right Ear: Hearing, tympanic membrane, external ear and ear canal normal.  Left Ear: Hearing, tympanic membrane, external ear and ear canal normal.  Nose: Mucosal edema (L>R, pale turbinates) present. No rhinorrhea or septal deviation.  No foreign bodies. Right sinus exhibits no maxillary sinus tenderness and no frontal sinus tenderness. Left sinus exhibits no maxillary sinus tenderness and no frontal sinus tenderness.  Mouth/Throat: Uvula is midline, oropharynx is clear and moist and mucous membranes are normal. No uvula swelling. No oropharyngeal exudate.  Eyes: Conjunctivae and EOM are normal. Pupils are equal, round, and reactive to light. Right eye exhibits no discharge. Left eye exhibits no discharge. No scleral icterus.  Neck: Trachea normal, normal range of motion and full passive range of motion without pain. Neck supple. No thyroid mass and no thyromegaly present.  Cardiovascular: Normal rate, regular rhythm and normal heart sounds.   Pulmonary/Chest: Effort normal and breath sounds normal.  Lymphadenopathy:       Head (right side): No submandibular, no tonsillar, no preauricular, no posterior auricular and no occipital adenopathy present.       Head (left side): No submandibular, no tonsillar, no preauricular and no occipital adenopathy present.    He has no cervical adenopathy.       Right:  No supraclavicular adenopathy present.       Left: No supraclavicular adenopathy present.  Neurological: He is alert and oriented to person, place, and time. He has normal strength. No cranial nerve deficit or sensory deficit.  Skin: Skin is warm, dry and intact. No rash noted.  Psychiatric: He has a normal mood and affect. His speech is normal and behavior is normal.           Assessment & Plan:   1. Allergic  rhinitis due to pollen Acute on chronic. Also now with eye symptoms. OTC cetirizine. Atrovent NS for acute symptoms. Resume Flonase as maintenance. If symptoms worsen/persist, re-evaluation. - ipratropium (ATROVENT) 0.03 % nasal spray; Place 2 sprays into both nostrils 2 (two) times daily.  Dispense: 30 mL; Refill: 0 - fluticasone (FLONASE) 50 MCG/ACT nasal spray; Place 2 sprays into both nostrils daily.  Dispense: 16 g; Refill: 12 - azelastine (OPTIVAR) 0.05 % ophthalmic solution; Place 1 drop into both eyes 2 (two) times daily.  Dispense: 6 mL; Refill: 0   2. Vitiligo Stable. - tacrolimus (PROTOPIC) 0.1 % ointment; Apply topically 2 (two) times daily.  Dispense: 100 g; Refill: 2  3. Hyperlipidemia Not fasting today. Recommend RTC for fasting labs in the next several months.    Fernande Brashelle S. Jonael Paradiso, PA-C Physician Assistant-Certified [facility]

## 2016-05-14 ENCOUNTER — Ambulatory Visit (INDEPENDENT_AMBULATORY_CARE_PROVIDER_SITE_OTHER): Payer: PPO | Admitting: Family Medicine

## 2016-05-14 ENCOUNTER — Encounter: Payer: Self-pay | Admitting: Family Medicine

## 2016-05-14 VITALS — BP 110/66 | HR 68 | Temp 98.7°F | Resp 18 | Ht 66.0 in | Wt 148.0 lb

## 2016-05-14 DIAGNOSIS — J301 Allergic rhinitis due to pollen: Secondary | ICD-10-CM

## 2016-05-14 DIAGNOSIS — L80 Vitiligo: Secondary | ICD-10-CM | POA: Diagnosis not present

## 2016-05-14 DIAGNOSIS — E785 Hyperlipidemia, unspecified: Secondary | ICD-10-CM | POA: Diagnosis not present

## 2016-05-14 LAB — LIPID PANEL
Cholesterol: 293 mg/dL — ABNORMAL HIGH (ref 125–200)
HDL: 198 mg/dL (ref >=40)
LDL CALC: 71 mg/dL (ref <130)
Total CHOL/HDL Ratio: 1.5 Ratio (ref <=5.0)
Triglycerides: 118 mg/dL (ref <150)
VLDL: 24 mg/dL (ref <30)

## 2016-05-14 MED ORDER — CETIRIZINE-PSEUDOEPHEDRINE ER 5-120 MG PO TB12: 1.0000 | ORAL_TABLET | Freq: Two times a day (BID) | ORAL | 0 refills | Status: AC

## 2016-05-14 MED ORDER — IPRATROPIUM BROMIDE 0.03 % NA SOLN: 2.0000 | Freq: Two times a day (BID) | NASAL | 0 refills | Status: DC

## 2016-05-14 MED ORDER — AZELASTINE HCL 0.05 % OP SOLN: 1.0000 [drp] | Freq: Two times a day (BID) | OPHTHALMIC | 0 refills | Status: DC

## 2016-05-14 MED ORDER — TACROLIMUS 0.1 % EX OINT: TOPICAL_OINTMENT | Freq: Two times a day (BID) | CUTANEOUS | 2 refills | Status: DC

## 2016-05-14 NOTE — Patient Instructions (Addendum)
You do have a cold.  This should get better in the next couple days.  In the meantime take the cetirizine D to help with the congestion. He should also use the Atrovent nasal spray to help with her nasal congestion.  You can use the Astelin eyedrops to help with the eye matting and itching.  Once these symptoms are gone you can stop these medicines.   Upper Respiratory Infection, Adult Most upper respiratory infections (URIs) are a viral infection of the air passages leading to the lungs. A URI affects the nose, throat, and upper air passages. The most common type of URI is nasopharyngitis and is typically referred to as "the common cold." URIs run their course and usually go away on their own. Most of the time, a URI does not require medical attention, but sometimes a bacterial infection in the upper airways can follow a viral infection. This is called a secondary infection. Sinus and middle ear infections are common types of secondary upper respiratory infections. Bacterial pneumonia can also complicate a URI. A URI can worsen asthma and chronic obstructive pulmonary disease (COPD). Sometimes, these complications can require emergency medical care and may be life threatening.  CAUSES Almost all URIs are caused by viruses. A virus is a type of germ and can spread from one person to another.  RISKS FACTORS You may be at risk for a URI if:   You smoke.   You have chronic heart or lung disease.  You have a weakened defense (immune) system.   You are very young or very old.   You have nasal allergies or asthma.  You work in crowded or poorly ventilated areas.  You work in health care facilities or schools. SIGNS AND SYMPTOMS  Symptoms typically develop 2-3 days after you come in contact with a cold virus. Most viral URIs last 7-10 days. However, viral URIs from the influenza virus (flu virus) can last 14-18 days and are typically more severe. Symptoms may include:   Runny or  stuffy (congested) nose.   Sneezing.   Cough.   Sore throat.   Headache.   Fatigue.   Fever.   Loss of appetite.   Pain in your forehead, behind your eyes, and over your cheekbones (sinus pain).  Muscle aches.  DIAGNOSIS  Your health care provider may diagnose a URI by:  Physical exam.  Tests to check that your symptoms are not due to another condition such as:  Strep throat.  Sinusitis.  Pneumonia.  Asthma. TREATMENT  A URI goes away on its own with time. It cannot be cured with medicines, but medicines may be prescribed or recommended to relieve symptoms. Medicines may help:  Reduce your fever.  Reduce your cough.  Relieve nasal congestion. HOME CARE INSTRUCTIONS   Take medicines only as directed by your health care provider.   Gargle warm saltwater or take cough drops to comfort your throat as directed by your health care provider.  Use a warm mist humidifier or inhale steam from a shower to increase air moisture. This may make it easier to breathe.  Drink enough fluid to keep your urine clear or pale yellow.   Eat soups and other clear broths and maintain good nutrition.   Rest as needed.   Return to work when your temperature has returned to normal or as your health care provider advises. You may need to stay home longer to avoid infecting others. You can also use a face mask and careful hand washing  to prevent spread of the virus.  Increase the usage of your inhaler if you have asthma.   Do not use any tobacco products, including cigarettes, chewing tobacco, or electronic cigarettes. If you need help quitting, ask your health care provider. PREVENTION  The best way to protect yourself from getting a cold is to practice good hygiene.   Avoid oral or hand contact with people with cold symptoms.   Wash your hands often if contact occurs.  There is no clear evidence that vitamin C, vitamin E, echinacea, or exercise reduces the chance  of developing a cold. However, it is always recommended to get plenty of rest, exercise, and practice good nutrition.  SEEK MEDICAL CARE IF:   You are getting worse rather than better.   Your symptoms are not controlled by medicine.   You have chills.  You have worsening shortness of breath.  You have brown or red mucus.  You have yellow or brown nasal discharge.  You have pain in your face, especially when you bend forward.  You have a fever.  You have swollen neck glands.  You have pain while swallowing.  You have white areas in the back of your throat. SEEK IMMEDIATE MEDICAL CARE IF:   You have severe or persistent:  Headache.  Ear pain.  Sinus pain.  Chest pain.  You have chronic lung disease and any of the following:  Wheezing.  Prolonged cough.  Coughing up blood.  A change in your usual mucus.  You have a stiff neck.  You have changes in your:  Vision.  Hearing.  Thinking.  Mood. MAKE SURE YOU:   Understand these instructions.  Will watch your condition.  Will get help right away if you are not doing well or get worse.   This information is not intended to replace advice given to you by your health care provider. Make sure you discuss any questions you have with your health care provider.   Document Released: 02/11/2001 Document Revised: 01/02/2015 Document Reviewed: 11/23/2013 Elsevier Interactive Patient Education 2016 [employer]     IF you received an x-ray today, you will receive an invoice from [facility]. Please contact [facility] at [phone] with questions or concerns regarding your invoice.   IF you received labwork today, you will receive an invoice from [facility]. Please contact [facility] at [phone] with questions or concerns regarding your invoice.   Our billing staff will not be able to assist you with questions regarding bills from these companies.  You will  be contacted with the lab results as soon as they are available. The fastest way to get your results is to activate your My Chart account. Instructions are located on the last page of this paperwork. If you have not heard from us regarding the results in 2 weeks, please contact this office.

## 2016-05-14 NOTE — Progress Notes (Signed)
   Miguel Stewart is a 35 y.o. male who presents to [facility] today URI symptoms:   URI symptoms:  Miguel Stewart is a 35 y.o. male who complains of URI symptoms present for past 2 days.  Describes rhinorrhea, sinus congestion, mild cough.  Has tried no OTC meds for relief.  Sick contacts are none that he knows of.  No fevers or chills. No nausea or vomiting.  Denies smoking cigarettes.  2.  Vitiligo:  Chronic.  [facility]. He has noted some improvement with this. As noted above he has not had any fevers or chills since starting this medicine. No weight gain or weight loss. No night sweats.  #3. HLD:  Last lipid panel listed below.    Currently is/not on Statin.  Denies any myalgias, icterus, jaundice.  Tolerating medications well.   Lab Results  Component Value Date   CHOL 258 (H) 06/13/2015   CHOL 276 (H) 06/15/2014   Lab Results  Component Value Date   HDL 162 06/13/2015   HDL 145 06/15/2014   Lab Results  Component Value Date   LDLCALC 80 06/13/2015   LDLCALC 103 (H) 06/15/2014   Lab Results  Component Value Date   TRIG 80 06/13/2015   TRIG 141 06/15/2014   Lab Results  Component Value Date   CHOLHDL 1.6 06/13/2015   CHOLHDL 1.9 06/15/2014     OBJECTIVE: BP 110/66 (BP Location: Right Arm, Patient Position: Sitting, Cuff Size: Small)   Pulse 68   Temp 98.7 F (37.1 C) (Oral)   Resp 18   Ht 5\' 6"  (1.676 m)   Wt 148 lb (67.1 kg)   SpO2 99%   BMI 23.89 kg/m  Gen:  Patient sitting on exam table, appears stated age in no acute distress Head: Normocephalic atraumatic Eyes: EOMI, PERRL, sclera and conjunctiva non-erythematous Ears:  Canals clear bilaterally.  TMs pearly gray bilaterally without erythema or bulging.   Nose:  Nasal turbinates grossly enlarged bilaterally. Some exudates noted. Tender to palpation of maxillary sinus  Mouth: Mucosa membranes moist. Tonsils +2, nonenlarged, non-erythematous.  Throat and  pharynx normal appearing. Neck: No cervical lymphadenopathy noted Heart:  RRR, no murmurs auscultated. Pulm:  Clear to auscultation bilaterally with good air movement.  No wheezes or rales noted.      Assessment and Plan:  1.  URI: Likely viral illness based on symptoms and history.  No signs of bacterial illness. Symptomatic treatment for now, see instructions. Return if worsening or no improvement in 1 week.   2.  Chronic vitiligo: - refill for chronic tacrolimus ointment.  - has been on this medicine for less than 24 months.    3.  HLD:  - history of the same.   - States he would like to be checked today for HLD.  - not taking statin currently, has been prescribed one in past.

## 2016-05-28 ENCOUNTER — Ambulatory Visit: Payer: PPO

## 2016-07-04 ENCOUNTER — Other Ambulatory Visit: Payer: Self-pay | Admitting: *Deleted

## 2016-07-16 ENCOUNTER — Ambulatory Visit (INDEPENDENT_AMBULATORY_CARE_PROVIDER_SITE_OTHER): Payer: PPO | Admitting: Physician Assistant

## 2016-07-16 VITALS — BP 106/66 | HR 65 | Temp 98.2°F | Resp 17 | Ht 66.0 in | Wt 147.0 lb

## 2016-07-16 DIAGNOSIS — R05 Cough: Secondary | ICD-10-CM | POA: Diagnosis not present

## 2016-07-16 DIAGNOSIS — R062 Wheezing: Secondary | ICD-10-CM | POA: Diagnosis not present

## 2016-07-16 DIAGNOSIS — L80 Vitiligo: Secondary | ICD-10-CM

## 2016-07-16 DIAGNOSIS — R059 Cough, unspecified: Secondary | ICD-10-CM

## 2016-07-16 MED ORDER — TACROLIMUS 0.1 % EX OINT: TOPICAL_OINTMENT | Freq: Two times a day (BID) | CUTANEOUS | 3 refills | Status: AC

## 2016-07-16 MED ORDER — ALBUTEROL SULFATE HFA 108 (90 BASE) MCG/ACT IN AERS: INHALATION_SPRAY | RESPIRATORY_TRACT | 1 refills | Status: AC

## 2016-07-16 MED ORDER — FLUTICASONE PROPIONATE 50 MCG/ACT NA SUSP: 2.0000 | Freq: Every day | NASAL | 12 refills | Status: AC

## 2016-07-16 MED ORDER — MUCINEX DM MAXIMUM STRENGTH 60-1200 MG PO TB12: 1.0000 | ORAL_TABLET | Freq: Two times a day (BID) | ORAL | 1 refills | Status: AC

## 2016-07-16 MED ORDER — BENZONATATE 100 MG PO CAPS: 100.0000 mg | ORAL_CAPSULE | Freq: Three times a day (TID) | ORAL | 0 refills | Status: AC | PRN

## 2016-07-16 NOTE — Patient Instructions (Addendum)
Please drink plenty of fluids to help you clear this infection. Drink water and teas, get plenty of clear fluids and get plenty of rest.   Use humidifier in room while you sleep, this will help stop your bloody nose. You can find this at [facility] You can put bacitracin ointment or neosporin in your nose.  Tessalon is for your cough. Please take this as directed.  Please use your nasal spray to help reduce swelling and drainage from your nose.  Use your inhaler every 4-6 hours as needed for wheezing.   Thank you for coming in today. I hope you feel we met your needs.  Feel free to call [facility] if you have any questions or further requests.  Please consider signing up for MyChart if you do not already have it, as this is a great way to communicate with me.  Best,  [employer], PA-C

## 2016-07-16 NOTE — Progress Notes (Signed)
Miguel Stewart  MRN: [id number] DOB: 03/20/1981  PCP: No PCP Per Patient  Subjective:  Pt is a 35 year old male who presents to clinic for cough and SOB x three weeks.  Complains of wheezing and SOB in the morning. This lasts through the morning, and gets better as the morning progressed. Takes 2 puffs Ventolin in the morning and this helps.   Cough x three weeks. Cough is productive and is present all day. Does not keep him up at night. Denies fever, chills, chest pain/pressure, sore throat.   Also notes blood in nose x 10 days. Only notices it on tissue, denies nose bleeds.   History of Vitiligo - Uses tacrolimus. He is out and would like a refill.   Review of Systems  Constitutional: Negative for chills, diaphoresis and fever.  HENT: Positive for congestion and rhinorrhea. Negative for postnasal drip, sinus pain, sinus pressure, sneezing, sore throat and trouble swallowing.   Respiratory: Positive for cough, shortness of breath and wheezing. Negative for chest tightness.   Cardiovascular: Negative for chest pain, palpitations and leg swelling.  Gastrointestinal: Negative for abdominal pain, diarrhea, nausea and vomiting.  Allergic/Immunologic: Positive for environmental allergies.  Neurological: Negative for dizziness, syncope, light-headedness and headaches.  Psychiatric/Behavioral: Negative for sleep disturbance. The patient is not nervous/anxious.     Patient Active Problem List   Diagnosis Date Noted  . Hyperlipidemia 04/19/2016  . Vitiligo 04/19/2016    Current Outpatient Prescriptions on File Prior to Visit  Medication Sig Dispense Refill  . cetirizine-pseudoephedrine (ZYRTEC-D) 5-120 MG tablet Take 1 tablet by mouth 2 (two) times daily. 30 tablet 0  . fluticasone (FLONASE) 50 MCG/ACT nasal spray Place 2 sprays into both nostrils daily. 16 g 12  . tacrolimus (PROTOPIC) 0.1 % ointment Apply topically 2 (two) times daily. 100 g 2  . atorvastatin (LIPITOR) 20 MG tablet  Take 1 tablet (20 mg total) by mouth daily. (Patient not taking: Reported on 07/16/2016) 90 tablet 3   No current facility-administered medications on file prior to visit.     No Known Allergies   Objective:  BP 106/66 (BP Location: Right Arm, Patient Position: Sitting, Cuff Size: Normal)   Pulse 65   Temp 98.2 F (36.8 C) (Oral)   Resp 17   Ht 5\' 6"  (1.676 m)   Wt 147 lb (66.7 kg)   SpO2 98%   BMI 23.73 kg/m   Physical Exam  Constitutional: He is oriented to person, place, and time and well-developed, well-nourished, and in no distress. No distress.  HENT:  Nose: Mucosal edema and rhinorrhea present. No epistaxis.  Mouth/Throat: Posterior oropharyngeal edema present. No oropharyngeal exudate or posterior oropharyngeal erythema.  Cardiovascular: Normal rate, regular rhythm and normal heart sounds.   Pulmonary/Chest: Effort normal and breath sounds normal. He has no decreased breath sounds. He has no wheezes. He has no rhonchi. He has no rales.  Neurological: He is alert and oriented to person, place, and time. GCS score is 15.  Skin: Skin is warm and dry.  Psychiatric: Mood, memory, affect and judgment normal.  Vitals reviewed.   Assessment and Plan :  1. Wheezing 2. Cough - albuterol (PROVENTIL HFA;VENTOLIN HFA) 108 (90 Base) MCG/ACT inhaler; 1-2 inhalations every 4-6 hours as needed for wheezing. Dispense spacer as needed.  Dispense: 1 Inhaler; Refill: 1 - Dextromethorphan-Guaifenesin (MUCINEX DM MAXIMUM STRENGTH) 60-1200 MG TB12; Take 1 tablet by mouth every 12 (twelve) hours.  Dispense: 14 each; Refill: 1 - benzonatate (TESSALON) 100 MG  capsule; Take 1-2 capsules (100-200 mg total) by mouth 3 (three) times daily as needed for cough.  Dispense: 40 capsule; Refill: 0 - fluticasone (FLONASE) 50 MCG/ACT nasal spray; Place 2 sprays into both nostrils daily.  Dispense: 16 g; Refill: 12 - Supportive care encouraged: Drink plenty of fluids, place humidifier in bedroom as this will  help prevent nose bleeds. RTC in 7-10 days if no improvement.   3. Vitiligo - tacrolimus (PROTOPIC) 0.1 % ointment; Apply topically 2 (two) times daily.  Dispense: 100 g; Refill: 3   Whitney Arlyne Brandes, PA-C  [facility] 07/16/2016 8:36 AM
